# Patient Record
Sex: Female | Born: 1970 | Hispanic: Yes | Marital: Married | Smoking: Never smoker
Health system: Southern US, Community
[De-identification: ages and names within clinical notes are randomized; demographics above are authoritative.]

## PROBLEM LIST (undated history)

## (undated) HISTORY — PX: TUBAL LIGATION: SHX77

---

## 2017-05-27 ENCOUNTER — Other Ambulatory Visit: Payer: Self-pay | Admitting: Obstetrics and Gynecology

## 2017-05-27 DIAGNOSIS — Z1231 Encounter for screening mammogram for malignant neoplasm of breast: Secondary | ICD-10-CM

## 2017-06-06 ENCOUNTER — Ambulatory Visit (HOSPITAL_COMMUNITY)
Admission: RE | Admit: 2017-06-06 | Discharge: 2017-06-06 | Disposition: A | Discharge status: Home / Self Care | Payer: Self-pay | Source: Ambulatory Visit | Attending: Obstetrics and Gynecology | Admitting: Obstetrics and Gynecology

## 2017-06-06 ENCOUNTER — Ambulatory Visit
Admission: RE | Admit: 2017-06-06 | Discharge: 2017-06-06 | Disposition: A | Discharge status: Home / Self Care | Payer: No Typology Code available for payment source | Source: Ambulatory Visit | Attending: Obstetrics and Gynecology | Admitting: Obstetrics and Gynecology

## 2017-06-06 ENCOUNTER — Encounter (HOSPITAL_COMMUNITY): Payer: Self-pay | Admitting: *Deleted

## 2017-06-06 VITALS — BP 104/68 | Ht <= 58 in | Wt 105.0 lb

## 2017-06-06 DIAGNOSIS — Z01419 Encounter for gynecological examination (general) (routine) without abnormal findings: Secondary | ICD-10-CM

## 2017-06-06 DIAGNOSIS — Z1231 Encounter for screening mammogram for malignant neoplasm of breast: Secondary | ICD-10-CM

## 2017-06-06 NOTE — Patient Instructions (Signed)
Explained breast self awareness with Ruth Mortonora Leonhard. Let patient know [facility] will cover Pap smears and HPV typing every 5 years unless has a history of abnormal Pap smears. [facility] Appointment scheduled for Thursday, June 06, 2017 at 0940. Let patient know will follow up with her within the next couple weeks with results with results of Pap smear by phone. Informed patient that the Breast Center will follow up with her within the next couple of weeks with results of mammogram by letter or phone. Ruth MortonDora Hollenberg verbalized understanding.  Kismet Facemire, Kathaleen Maserhristine Poll, RN 12:19 PM

## 2017-06-06 NOTE — Progress Notes (Signed)
No complaints today.   Pap Smear: Pap smear completed today. Last Pap smear was 5 years ago and normal per patient. Per patient has no history of an abnormal Pap smear. No Pap smear results are in EPIC.  Physical exam: Breasts Breasts symmetrical. No skin abnormalities bilateral breasts. No nipple retraction bilateral breasts. No nipple discharge bilateral breasts. No lymphadenopathy. No lumps palpated bilateral breasts. No complaints of pain or tenderness on exam. [facility] mamm[facility]. Appointment scheduled for Thursday, June 06, 2017 at 0940.  Pelvic/Bimanual   Ext Genitalia No lesions, no swelling and no discharge observed on external genitalia.         Vagina Vagina pink and normal texture. No lesions or discharge observed in vagina.          Cervix Cervix is present. Cervix pink and of normal texture. No discharge observed.     Uterus Uterus is present and palpable. Uterus in normal position and normal size.        Adnexae Bilateral ovaries present and palpable. No tenderness on palpation.          Rectovaginal No rectal exam completed today since patient had no rectal complaints. No skin abnormalities observed on exam.    Smoking History: Patient has never smoked.  Patient Navigation: Patient education provided. [facility] provided [facility]. Spanish interpreter provided.  Used Spanish interpreter [employer]anca Lindner from [employer].

## 2017-06-07 ENCOUNTER — Encounter (HOSPITAL_COMMUNITY): Payer: Self-pay | Admitting: *Deleted

## 2017-06-10 LAB — CYTOLOGY - PAP
DIAGNOSIS: NEGATIVE
HPV: NOT DETECTED

## 2017-06-17 ENCOUNTER — Encounter (HOSPITAL_COMMUNITY): Payer: Self-pay | Admitting: *Deleted

## 2017-06-17 NOTE — Progress Notes (Signed)
Letter mailed to patient with negative pap smear results. Next pap smear due in five years.  

## 2018-07-25 ENCOUNTER — Other Ambulatory Visit: Payer: Self-pay | Admitting: Obstetrics and Gynecology

## 2018-07-25 DIAGNOSIS — Z1231 Encounter for screening mammogram for malignant neoplasm of breast: Secondary | ICD-10-CM

## 2018-09-04 ENCOUNTER — Ambulatory Visit (HOSPITAL_COMMUNITY): Payer: No Typology Code available for payment source

## 2018-09-04 ENCOUNTER — Ambulatory Visit: Payer: No Typology Code available for payment source

## 2018-09-05 ENCOUNTER — Inpatient Hospital Stay: Payer: Self-pay

## 2018-09-05 ENCOUNTER — Inpatient Hospital Stay: Payer: No Typology Code available for payment source | Attending: Obstetrics and Gynecology | Admitting: *Deleted

## 2018-09-05 ENCOUNTER — Other Ambulatory Visit (HOSPITAL_COMMUNITY): Payer: Self-pay | Admitting: *Deleted

## 2018-09-05 VITALS — BP 118/70 | Ht 59.0 in | Wt 109.0 lb

## 2018-09-05 DIAGNOSIS — Z0000 Encounter for general adult medical examination without abnormal findings: Secondary | ICD-10-CM

## 2018-09-05 LAB — HEMOGLOBIN A1C
HEMOGLOBIN A1C: 5.1 % (ref 4.8–5.6)
MEAN PLASMA GLUCOSE: 99.67 mg/dL

## 2018-09-05 LAB — LIPID PANEL
CHOL/HDL RATIO: 3 ratio
Cholesterol: 173 mg/dL (ref 0–200)
HDL: 58 mg/dL (ref >40)
LDL CALC: 103 mg/dL — AB (ref 0–99)
Triglycerides: 62 mg/dL (ref <150)
VLDL: 12 mg/dL (ref 0–40)

## 2018-09-05 NOTE — Progress Notes (Signed)
[facility] initial screening  Spanish interpreter- Ruth RoyalsJulie Ballard  Clinical Measurement:  Height: 59in  Weight:  109lb  Blood Pressure:  120/72 Blood Pressure #2:  118/70   Fasting Labs Drawn Today, will review with patient when they result.  Medical History:  Patient states that she has not been diagnosed with high cholesterol, high blood pressure, diabetes or heart disease.  Medications:  Patients states she is not taking any medications for high cholesterol, high blood pressure or diabetes.  She is not taking aspirin daily to prevent heart attack or stroke.    Blood pressure, self measurement:  Patients states she does not measure blood pressure at home.    Nutrition:  Patient states she eats 0.25 cups of fruit and 1 cup of vegetables in an average day.  Patient states she does not eat fish regularly, she eats more than half a serving of whole grains daily. She drinks less than 36 ounces of beverages with added sugar weekly.  She is currently watching her sodium intake.  She has not had any drinks containing alcohol in the last seven days.    Physical activity:  Patient states that she gets 40 minutes of moderate exercise in a week.  She gets 0 minutes of vigorous exercise per week.    Smoking status:  Patient states she has never smoked and is not around any smokers.   Quality of life:  Patient states that she has had 0 bad physical days out of the last 30 days. In the last 2 weeks, she has had some  days that she has felt down or depressed. She has had a few days in the last 2 weeks that she has had little interest or pleasure in doing things.   Risk reduction and counseling:  Patient states she wants to  increase fruit and vegetable intake.  I encouraged her to increase her   Navigation:  I will notify patient of lab results.  Patient is aware of 2 more health coaching sessions and a follow up.current exercise regimen and increase vegetable and fruit intake.

## 2018-09-10 ENCOUNTER — Ambulatory Visit: Payer: No Typology Code available for payment source

## 2018-09-15 ENCOUNTER — Telehealth (HOSPITAL_COMMUNITY): Payer: Self-pay | Admitting: *Deleted

## 2018-09-15 NOTE — Telephone Encounter (Signed)
  Health coaching 2  Spanish interpreter- Raynelle FanningJulie Sowell  Labs- LDL cholesterol 103, cholesterol 173, HDL cholesterol 58, triglycerides 62, hemoglobin A1C 5.1, mean plasma glucose 99.67  Patient is aware and understands these results.  Goals- patient states that she eats skinless chicken and lots of fruits and vegetables.  Patient states that she eats oatmeal every morning and run 4 miles daily. I encouraged patient to keep up the good work with her diet and exercise regimen.  Navigation:  Patient is aware of 1 more health coaching sessions and a follow up.  Time- 10 minutes

## 2018-11-18 ENCOUNTER — Encounter (HOSPITAL_COMMUNITY): Payer: Self-pay

## 2018-11-18 ENCOUNTER — Ambulatory Visit
Admission: RE | Admit: 2018-11-18 | Discharge: 2018-11-18 | Disposition: A | Discharge status: Home / Self Care | Payer: No Typology Code available for payment source | Source: Ambulatory Visit | Attending: Obstetrics and Gynecology | Admitting: Obstetrics and Gynecology

## 2018-11-18 ENCOUNTER — Ambulatory Visit (HOSPITAL_COMMUNITY)
Admission: RE | Admit: 2018-11-18 | Discharge: 2018-11-18 | Disposition: A | Discharge status: Home / Self Care | Payer: No Typology Code available for payment source | Source: Ambulatory Visit | Attending: Obstetrics and Gynecology | Admitting: Obstetrics and Gynecology

## 2018-11-18 VITALS — BP 108/72 | Wt 108.0 lb

## 2018-11-18 DIAGNOSIS — Z1239 Encounter for other screening for malignant neoplasm of breast: Secondary | ICD-10-CM

## 2018-11-18 DIAGNOSIS — Z1231 Encounter for screening mammogram for malignant neoplasm of breast: Secondary | ICD-10-CM

## 2018-11-18 NOTE — Patient Instructions (Signed)
Explained breast self awareness with Ruth Mortonora Jaquay. Patient did not need a Pap smear today due to last Pap smear and HPV Typing was 06/06/2017. Let her know [facility]CP will cover Pap smears and HPV typing every 5 years unless has a history of abnormal Pap smears. [facility] mamm[facility]. Appointment scheduled for Tuesday, November 18, 2018 at 1310. Patient aware of appointment and will be there. Let patient know the Breast Center will follow up with her within the next couple weeks with results of mammogram by letter or phone. Ruth MortonDora Lalanne verbalized understanding.  Dejana Pugsley, Kathaleen Maserhristine Poll, RN 12:30 PM

## 2018-11-18 NOTE — Progress Notes (Signed)
No complaints today.   Pap Smear: Pap smear not completed today. [facility] and normal with negative HPV. Per patient has no history of an abnormal Pap smear. Last Pap smear result is in Epic.  Physical exam: Breasts Breasts symmetrical. No skin abnormalities bilateral breasts. No nipple retraction bilateral breasts. No nipple discharge bilateral breasts. No lymphadenopathy. No lumps palpated bilateral breasts. No complaints of pain or tenderness on exam. [facility] mamm[facility]. Appointment scheduled for Tuesday, November 18, 2018 at 1310.        Pelvic/Bimanual No Pap smear completed today since last Pap smear and HPV typing was 06/06/2017. Pap smear not indicated per BCCCP guidelines.   Smoking History: Patient has never smoked.  Patient Navigation: Patient education provided. Access to services provided for [facility]. Spanish interpreter provided.   Breast and Cervical Cancer Risk Assessment: Patient has no family history of breast cancer, known genetic mutations, or radiation treatment to the chest before age 730. Patient has no history of cervical dysplasia, immunocompromised, or DES exposure in-utero.  Risk Assessment    Risk Scores      11/18/2018   Last edited by: Lynnell DikeHolland, Sabrina H, LPN   5-year risk: 0.4 %   Lifetime risk: 4.9 %         Used Spanish interpreter Natale LayErika McReynolds from [facility].

## 2018-11-19 ENCOUNTER — Telehealth (HOSPITAL_COMMUNITY): Payer: Self-pay | Admitting: *Deleted

## 2018-11-19 NOTE — Telephone Encounter (Signed)
Health Coaching 3   Spanish interpreter- Ruth RoyalsJulie Ballard   Goals-  New goal- Patient states that her schedule has changed at her job and she is unable to exercise.  Patient states that she has increased  her consumption of vegetables and fruits to total 3.  I encouraged patient to try to get in at least 15 minutes daily and to increase fiber intake through consumption of fruits and vegetables.  Barrier to reaching goal- Patient states that her job is a barrier to exercise.  Strategies to overcome barriers-  Patient states that she will try to walk at least 15 minutes during the week and exercise on the weekends.  Navigation:  Patient is a follow up.

## 2018-11-24 ENCOUNTER — Encounter (HOSPITAL_COMMUNITY): Payer: Self-pay | Admitting: *Deleted

## 2021-06-28 ENCOUNTER — Other Ambulatory Visit: Payer: Self-pay

## 2021-06-28 DIAGNOSIS — N644 Mastodynia: Secondary | ICD-10-CM

## 2021-07-18 ENCOUNTER — Other Ambulatory Visit: Payer: Self-pay

## 2021-07-18 ENCOUNTER — Ambulatory Visit: Payer: Self-pay | Admitting: *Deleted

## 2021-07-18 ENCOUNTER — Ambulatory Visit
Admission: RE | Admit: 2021-07-18 | Discharge: 2021-07-18 | Disposition: A | Discharge status: Home / Self Care | Payer: No Typology Code available for payment source | Source: Ambulatory Visit | Attending: Obstetrics and Gynecology | Admitting: Obstetrics and Gynecology

## 2021-07-18 ENCOUNTER — Ambulatory Visit: Admission: RE | Admit: 2021-07-18 | Payer: No Typology Code available for payment source | Source: Ambulatory Visit

## 2021-07-18 VITALS — BP 106/76 | Wt 105.2 lb

## 2021-07-18 DIAGNOSIS — N644 Mastodynia: Secondary | ICD-10-CM

## 2021-07-18 DIAGNOSIS — Z1239 Encounter for other screening for malignant neoplasm of breast: Secondary | ICD-10-CM

## 2021-07-18 NOTE — Progress Notes (Addendum)
Ms. Nakeitha Milligan is a 50 y.o. female who presents to [facility] clinic today with complaint of right outer breast pain x one month that comes and goes. Patient states the pain has decreased over the past two weeks. Patient rates the pain at a 7 out of 10.    Pap Smear: Pap smear not completed today. Last Pap smear was 06/06/2017 at [facility] and normal with negative HPV. Per patient has no history of an abnormal Pap smear. Last Pap smear result is in Epic.   Physical exam: Breasts Breasts symmetrical. No skin abnormalities bilateral breasts. No nipple retraction bilateral breasts. No nipple discharge bilateral breasts. No lymphadenopathy. No lumps palpated bilateral breasts. Complaints of right outer breast tenderness on exam.     MS DIGITAL SCREENING BILATERAL  Result Date: 06/06/2017 CLINICAL DATA:  Screening. Baseline examination. EXAM: DIGITAL SCREENING BILATERAL MAMMOGRAM WITH CAD COMPARISON:  None. ACR Breast Density Category c: The breast tissue is heterogeneously dense, which may obscure small masses FINDINGS: There are no findings suspicious for malignancy. Images were processed with CAD. IMPRESSION: No mammographic evidence of malignancy. A result letter of this screening mammogram will be mailed directly to the patient. RECOMMENDATION: Screening mammogram in one year. (Code:SM-B-01Y) BI-RADS CATEGORY  1: Negative. Electronically Signed   By: Harmon Pier M.D.   On: 06/06/2017 10:58   MS DIGITAL SCREENING TOMO BILATERAL  Result Date: 11/18/2018 CLINICAL DATA:  Screening. EXAM: DIGITAL SCREENING BILATERAL MAMMOGRAM WITH TOMO AND CAD COMPARISON:  Previous exam(s). ACR Breast Density Category c: The breast tissue is heterogeneously dense, which may obscure small masses. FINDINGS: There are no findings suspicious for malignancy. Images were processed with CAD. IMPRESSION: No mammographic evidence of malignancy. A result letter of this screening mammogram will be mailed directly to the patient.  RECOMMENDATION: Screening mammogram in one year. (Code:SM-B-01Y) BI-RADS CATEGORY  1: Negative. Electronically Signed   By: Gerome Sam III M.D   On: 11/18/2018 12:21     Pelvic/Bimanual Pap is not indicated today per BCCCP guidelines.   Smoking History: Patient has never smoked.   Patient Navigation: Patient education provided. Access to services provided for patient through [employer] program. Used Spanish interpreter Natale Lay from [facility] provided.   Colorectal Cancer Screening: Per patient has never had colonoscopy completed. No complaints today.    Breast and Cervical Cancer Risk Assessment: Patient does not have family history of breast cancer, known genetic mutations, or radiation treatment to the chest before age 61. Patient does not have history of cervical dysplasia, immunocompromised, or DES exposure in-utero.  Risk Assessment     Risk Scores       07/18/2021 11/18/2018   Last edited by: Meryl Dare, CMA Stoney Bang H, LPN   5-year risk: 0.5 % 0.4 %   Lifetime risk: 4.7 % 4.9 %            A: BCCCP exam without pap smear Complaint of right outer breast pain.  P: Referred patient to the [facility] for a diagnostic mammogram. Appointment scheduled Tuesday, July 18, 2021 at 1240.  Priscille Heidelberg, RN 07/18/2021 10:56 AM

## 2021-07-18 NOTE — Patient Instructions (Signed)
Explained breast self awareness with Ruth Ballard. Patient did not need a Pap smear today due to last Pap smear and HPV typing was 06/06/2017. Let her know BCCCP will cover Pap smears and HPV typing every 5 years unless has a history of abnormal Pap smears. Referred patient to the [facility] for a diagnostic mammogram. Appointment scheduled Tuesday, July 18, 2021 at 1240. Patient aware of appointment and will be there. Ruth Ballard verbalized understanding.  Ruth Ballard, Ruth Maser, RN 10:56 AM

## 2023-03-01 IMAGING — MG DIGITAL DIAGNOSTIC BILAT W/ TOMO W/ CAD
8 series · 9 of 24 positions shown · non-contrast
Comparison: Previous exam(s).

CLINICAL DATA: 50-year-old with diffuse outer RIGHT breast pain.
Annual evaluation, LEFT breast.

EXAM:
DIGITAL DIAGNOSTIC BILATERAL MAMMOGRAM WITH TOMOSYNTHESIS AND CAD
TECHNIQUE: Bilateral digital diagnostic mammography and breast tomosynthesis
was performed. The images were evaluated with computer-aided
detection.

[R CC synth-2D]
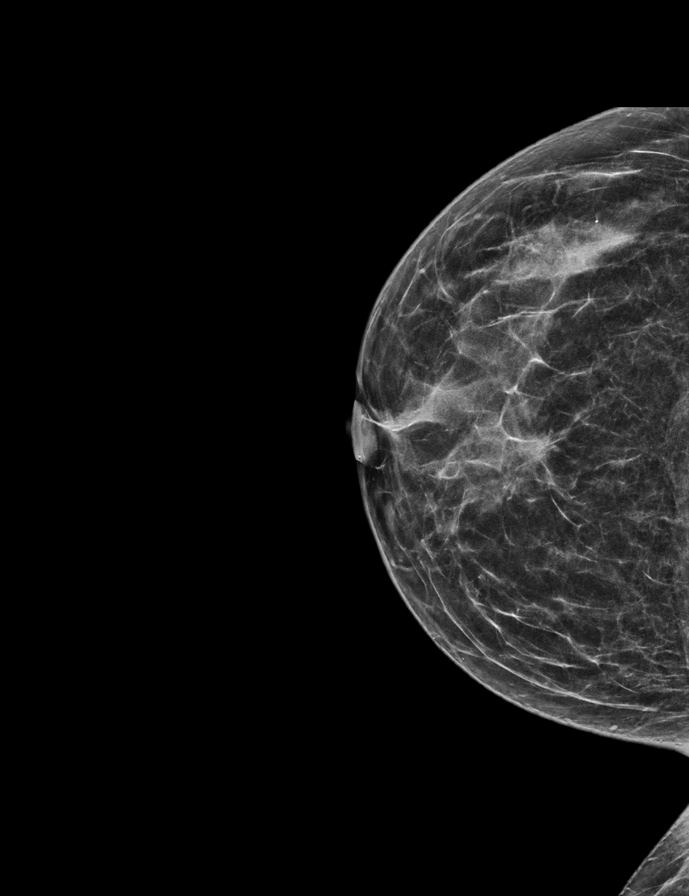

[L CC synth-2D]
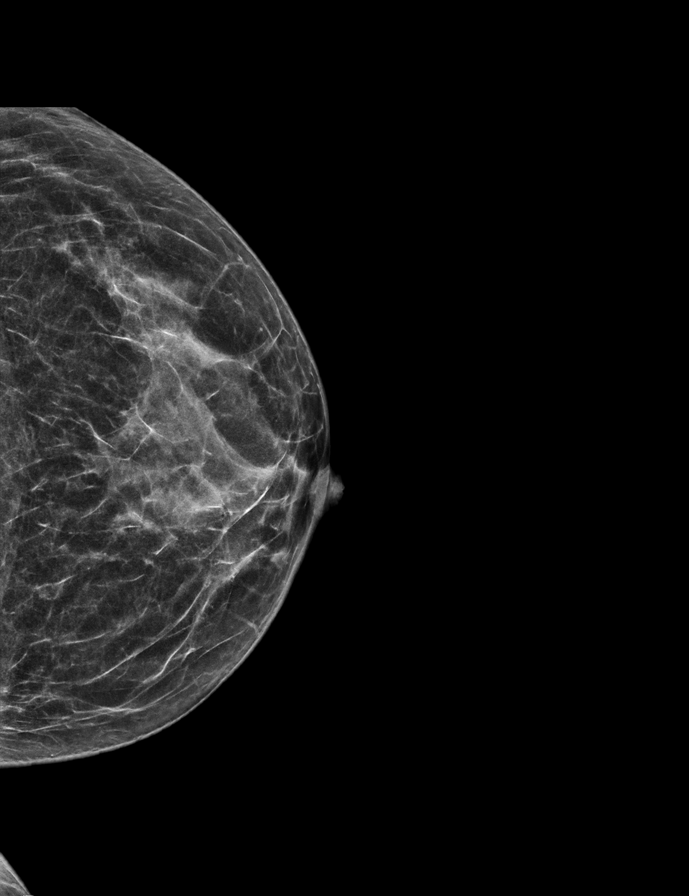

[L MLO synth-2D]
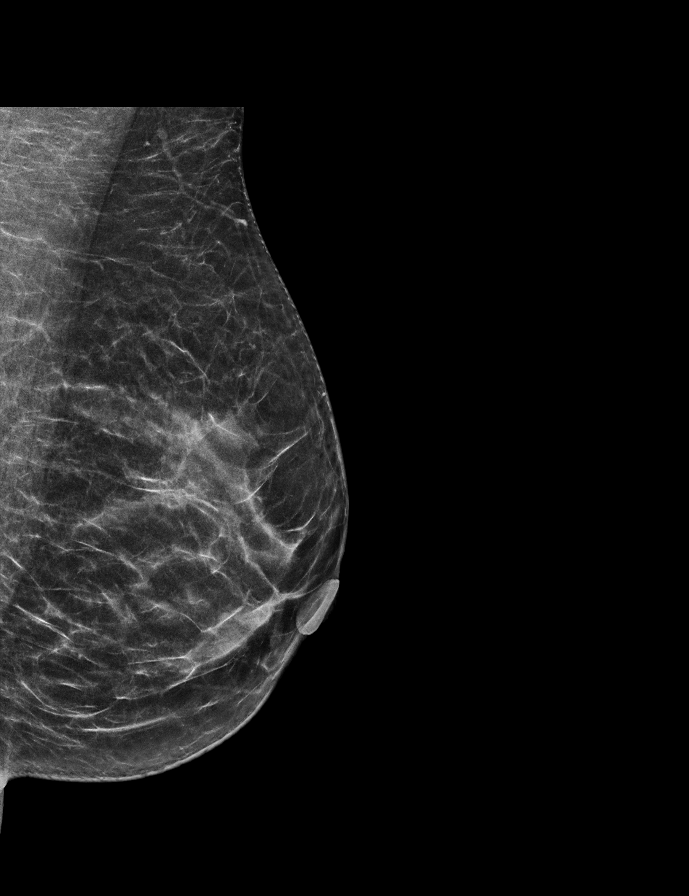

[R MLO synth-2D]
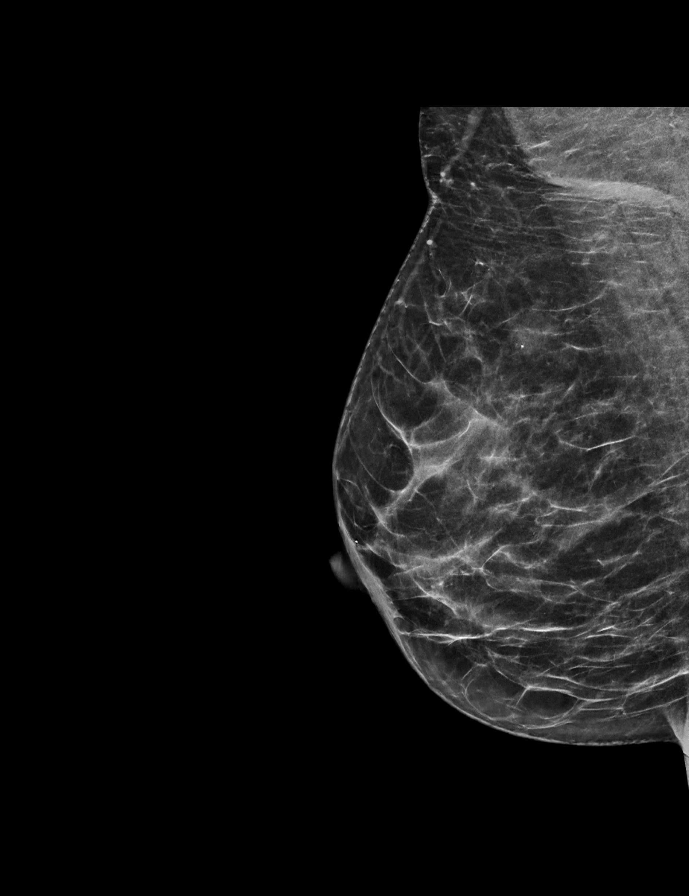

[L CC tomo · 2 of 49 frames shown]
[frame 16/49]
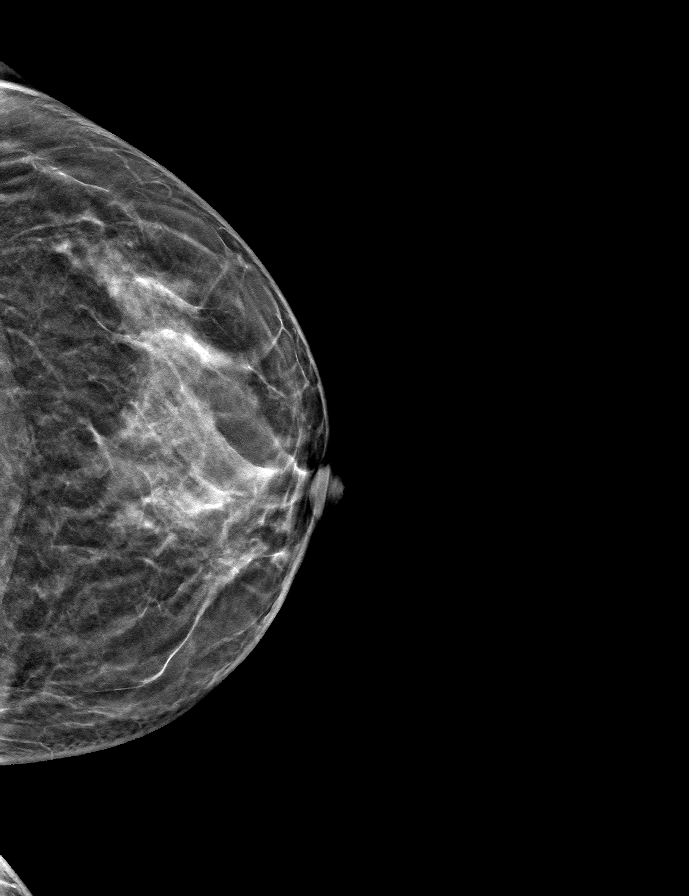
[frame 25/49]
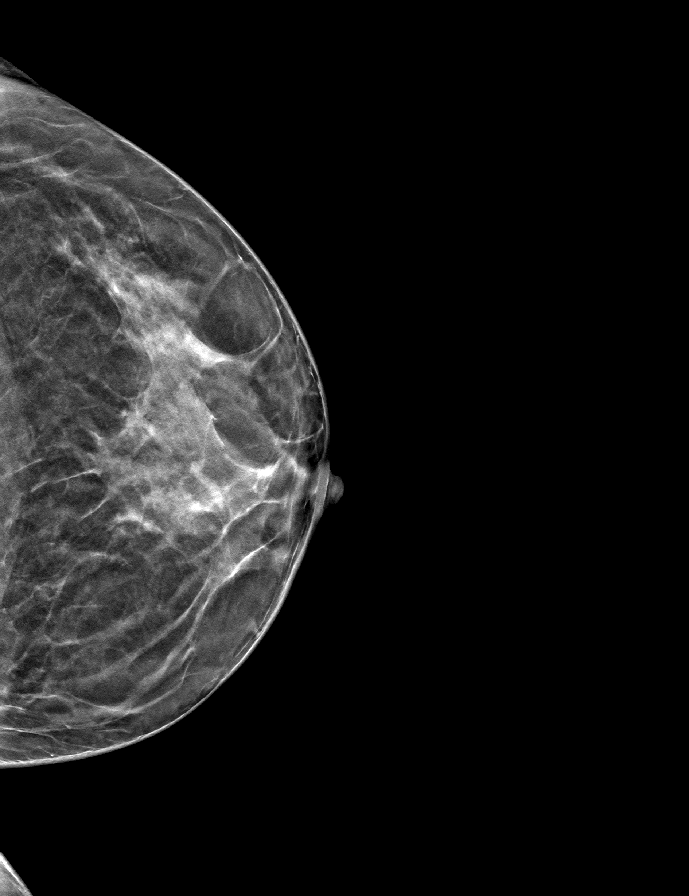

[R CC tomo · tomo slice 25/48.0]
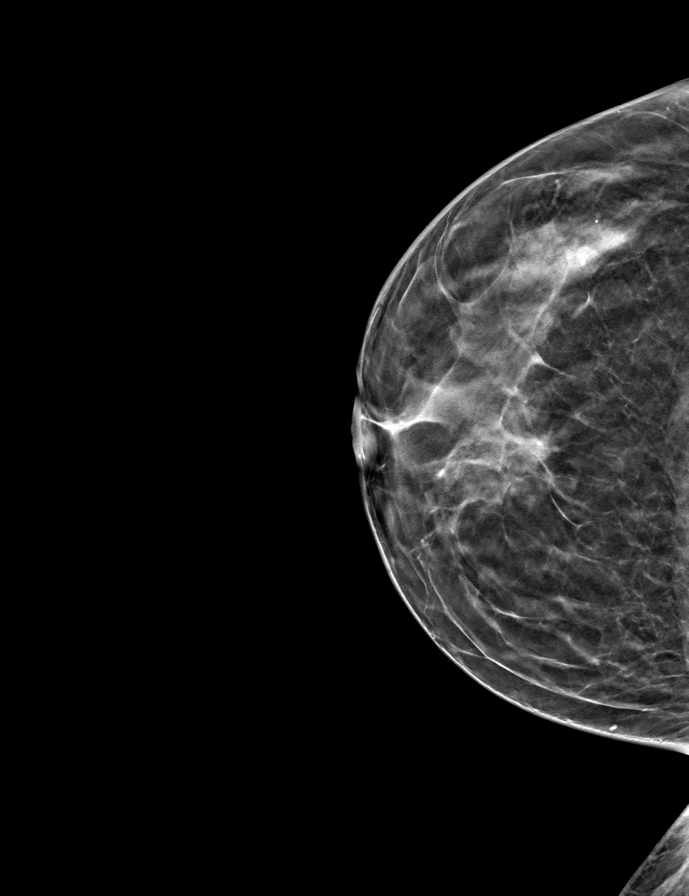

[L MLO tomo · tomo slice 27/52.0]
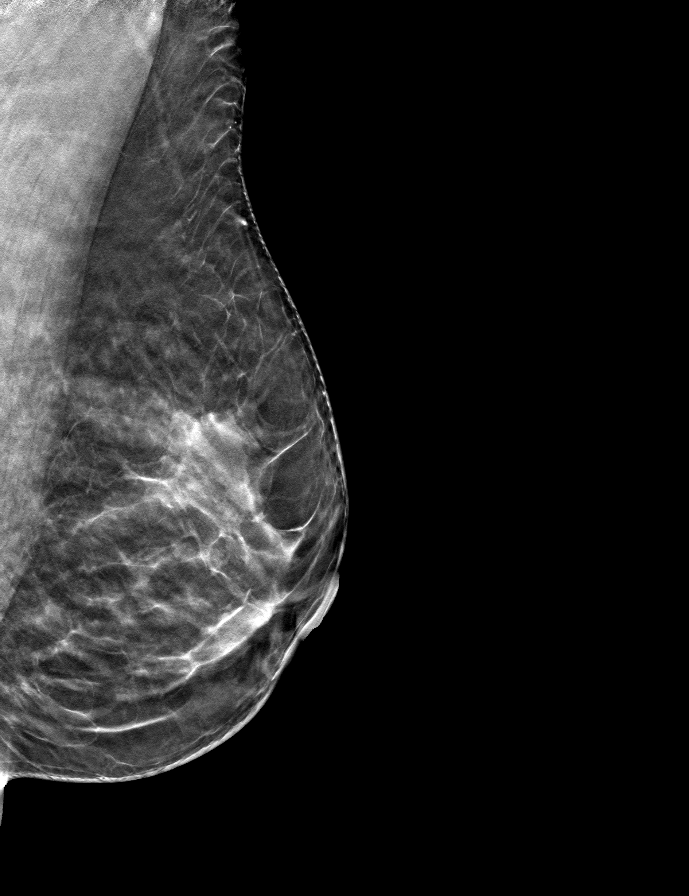

[R MLO tomo · tomo slice 27/54.0]
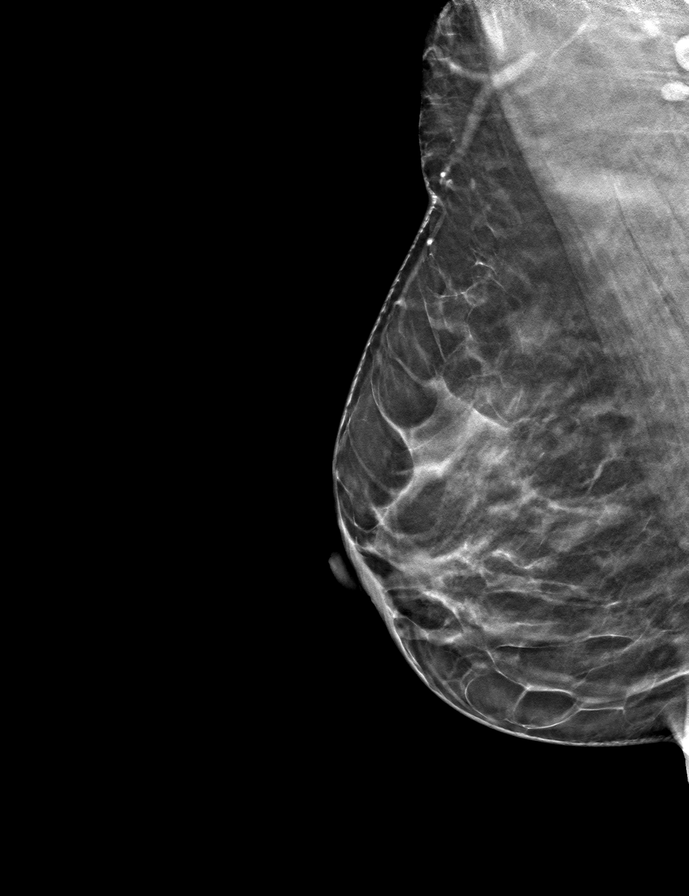

[9 of 24 positions shown; findings below may reference images not displayed]

ACR Breast Density Category c: The breast tissue is heterogeneously
dense, which may obscure small masses.
FINDINGS: Full field CC and MLO views of both breasts were obtained.

RIGHT: No findings suspicious for malignancy. Specifically, no
mammographic abnormalities in the outer breast.

LEFT: No findings suspicious for malignancy.
IMPRESSION: No mammographic evidence of malignancy involving either breast.

RECOMMENDATION:
Screening mammogram in one year.(Code:[id number])

Strategies for alleviating breast pain including decreasing caffeine
intake and vitamin-E supplementation were discussed with the
patient.

I have discussed the findings and recommendations with the patient.
Communication with the patient was achieved with the assistance of a
certified interpreter. If applicable, a reminder letter will be sent
to the patient regarding the next appointment.

BI-RADS CATEGORY  1: Negative.

## 2024-07-22 ENCOUNTER — Other Ambulatory Visit (HOSPITAL_BASED_OUTPATIENT_CLINIC_OR_DEPARTMENT_OTHER): Payer: Self-pay | Admitting: Nurse Practitioner

## 2024-07-22 DIAGNOSIS — Z1231 Encounter for screening mammogram for malignant neoplasm of breast: Secondary | ICD-10-CM
